# Patient Record
Sex: Female | Born: 1970 | Race: White | Hispanic: No | Smoking: Never smoker
Health system: Southern US, Community
[De-identification: ages and names within clinical notes are randomized; demographics above are authoritative.]

## PROBLEM LIST (undated history)

## (undated) HISTORY — PX: CHOLECYSTECTOMY: SHX55

---

## 1999-02-24 ENCOUNTER — Other Ambulatory Visit: Admission: RE | Admit: 1999-02-24 | Discharge: 1999-02-24 | Payer: Self-pay | Admitting: *Deleted

## 1999-06-27 ENCOUNTER — Encounter: Admission: RE | Admit: 1999-06-27 | Discharge: 1999-09-25 | Payer: Self-pay | Admitting: *Deleted

## 1999-09-02 ENCOUNTER — Inpatient Hospital Stay (HOSPITAL_COMMUNITY): Admission: AD | Admit: 1999-09-02 | Discharge: 1999-09-05 | Payer: Self-pay | Admitting: *Deleted

## 1999-09-06 ENCOUNTER — Encounter: Admission: RE | Admit: 1999-09-06 | Discharge: 1999-10-13 | Payer: Self-pay | Admitting: *Deleted

## 1999-10-05 ENCOUNTER — Other Ambulatory Visit: Admission: RE | Admit: 1999-10-05 | Discharge: 1999-10-05 | Payer: Self-pay | Admitting: *Deleted

## 2001-05-27 ENCOUNTER — Other Ambulatory Visit: Admission: RE | Admit: 2001-05-27 | Discharge: 2001-05-27 | Payer: Self-pay | Admitting: *Deleted

## 2001-12-03 ENCOUNTER — Inpatient Hospital Stay (HOSPITAL_COMMUNITY): Admission: AD | Admit: 2001-12-03 | Discharge: 2001-12-05 | Payer: Self-pay | Admitting: Obstetrics and Gynecology

## 2001-12-06 ENCOUNTER — Encounter: Admission: RE | Admit: 2001-12-06 | Discharge: 2002-01-05 | Payer: Self-pay | Admitting: *Deleted

## 2002-01-12 ENCOUNTER — Other Ambulatory Visit: Admission: RE | Admit: 2002-01-12 | Discharge: 2002-01-12 | Payer: Self-pay | Admitting: *Deleted

## 2003-01-20 ENCOUNTER — Other Ambulatory Visit: Admission: RE | Admit: 2003-01-20 | Discharge: 2003-01-20 | Payer: Self-pay | Admitting: *Deleted

## 2003-09-22 ENCOUNTER — Encounter: Admission: RE | Admit: 2003-09-22 | Discharge: 2003-09-22 | Payer: Self-pay | Admitting: Family Medicine

## 2004-02-22 ENCOUNTER — Other Ambulatory Visit: Admission: RE | Admit: 2004-02-22 | Discharge: 2004-02-22 | Payer: Self-pay | Admitting: Obstetrics and Gynecology

## 2005-06-08 ENCOUNTER — Other Ambulatory Visit: Admission: RE | Admit: 2005-06-08 | Discharge: 2005-06-08 | Payer: Self-pay | Admitting: Obstetrics and Gynecology

## 2005-07-25 ENCOUNTER — Encounter: Admission: RE | Admit: 2005-07-25 | Discharge: 2005-07-25 | Payer: Self-pay | Admitting: General Surgery

## 2005-09-20 ENCOUNTER — Encounter: Admission: RE | Admit: 2005-09-20 | Discharge: 2005-09-20 | Payer: Self-pay | Admitting: Obstetrics and Gynecology

## 2006-07-17 IMAGING — CT CT PELVIS W/ CM
1 of 3 series · 14 of 32 positions shown, 19 images · IV contrast (REDICAT/H2O & OMNIPAQUE [ID])
Comparison: none

CLINICAL DATA: Ventral hernia

[Series 2: — · axial · 0.98mm/px · z∈[-474,-69]mm · 14 of 91 slices shown, 19 images]
[im 5/91  soft-tissue]
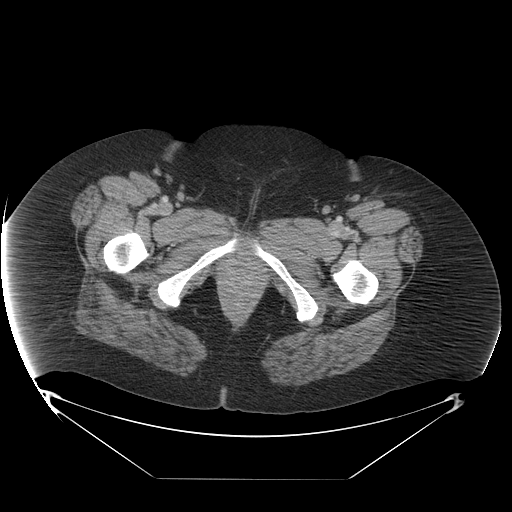
[im 5/91  bone]
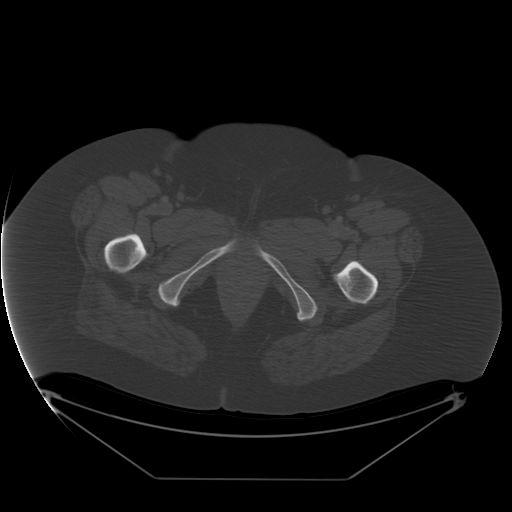
[im 14/91  soft-tissue]
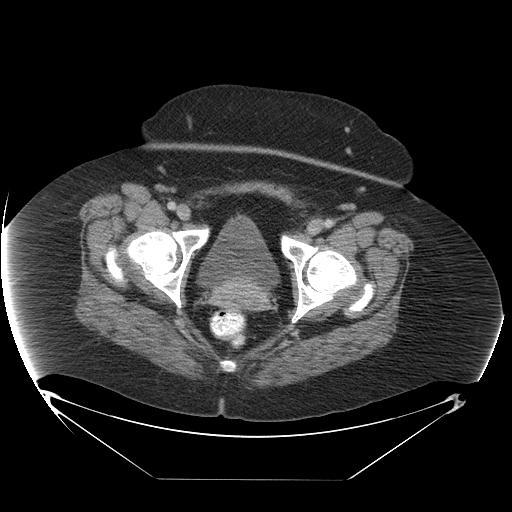
[im 19/91  soft-tissue]
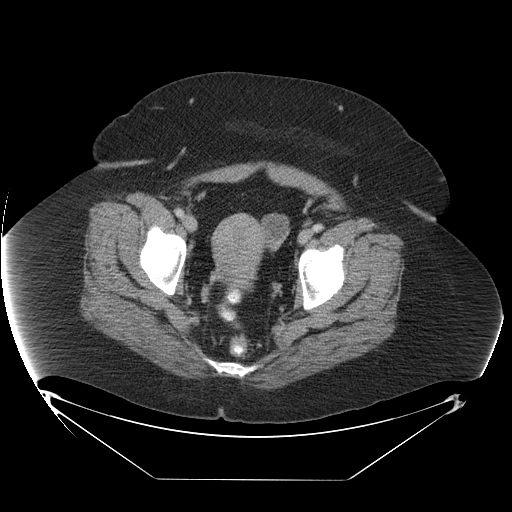
[im 28/91  soft-tissue]
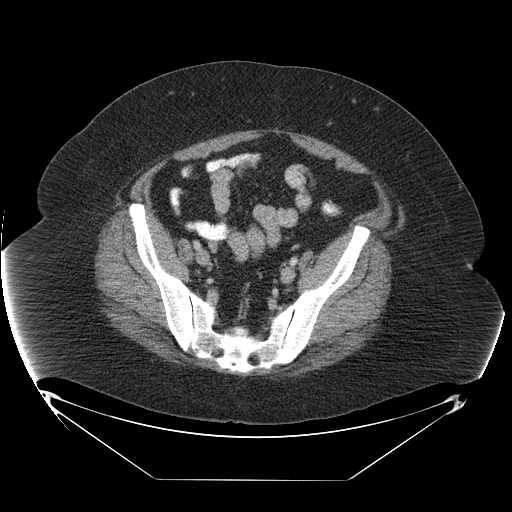
[im 32/91  soft-tissue]
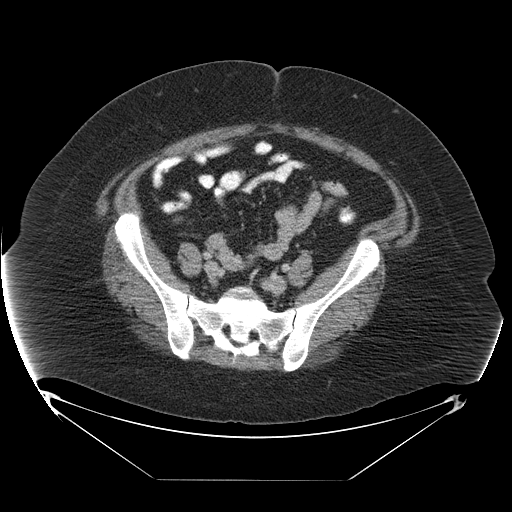
[im 41/91  soft-tissue]
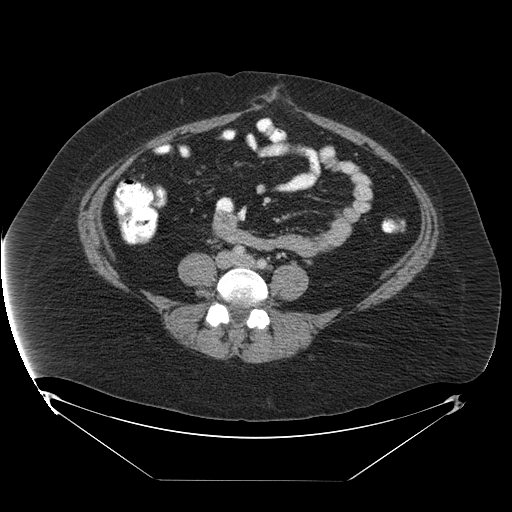
[im 46/91  soft-tissue]
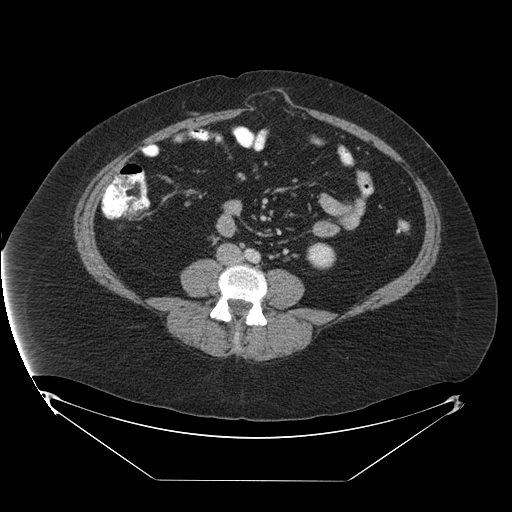
[im 50/91  soft-tissue]
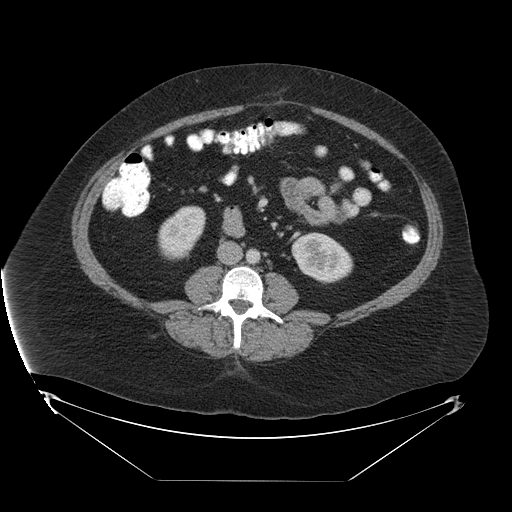
[im 59/91  soft-tissue]
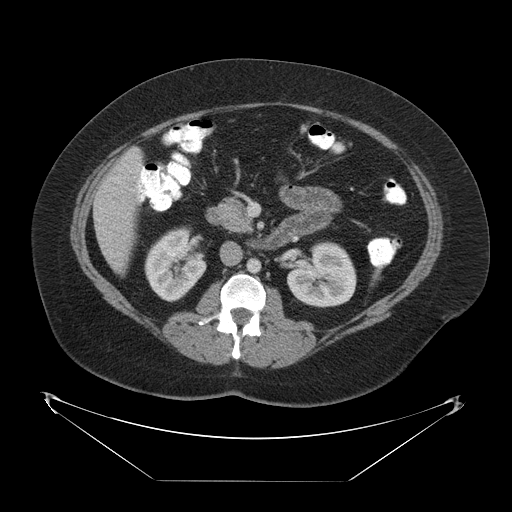
[im 59/91  bone]
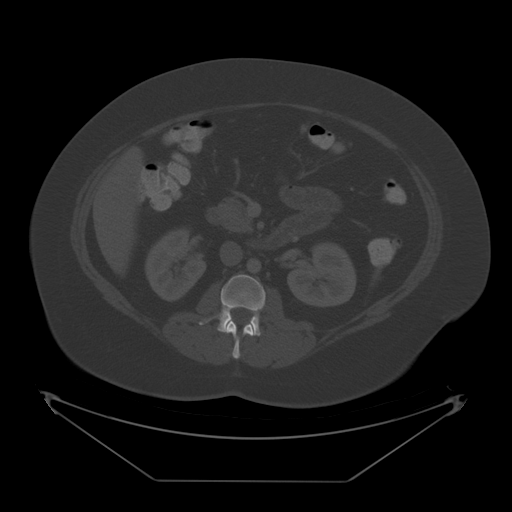
[im 64/91  soft-tissue]
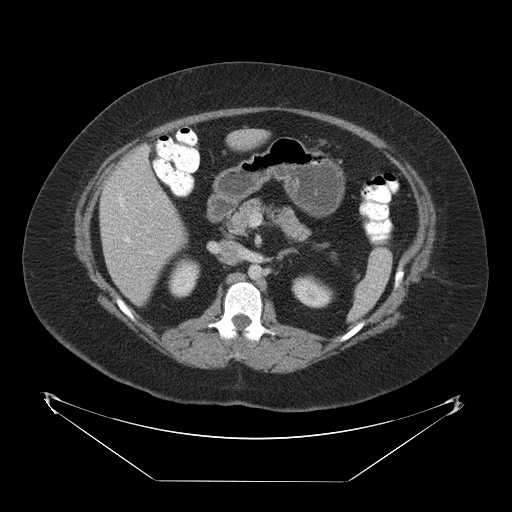
[im 73/91  soft-tissue]
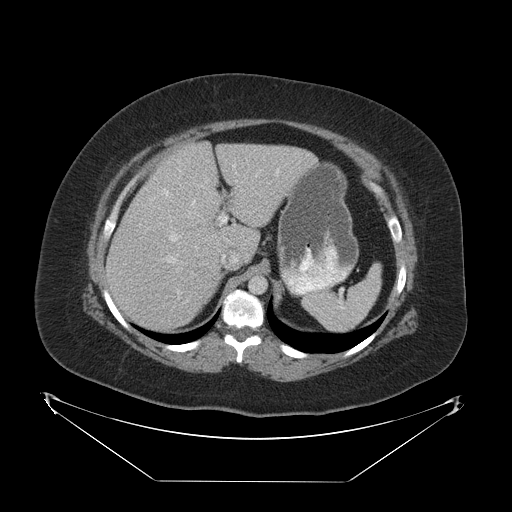
[im 73/91  lung]
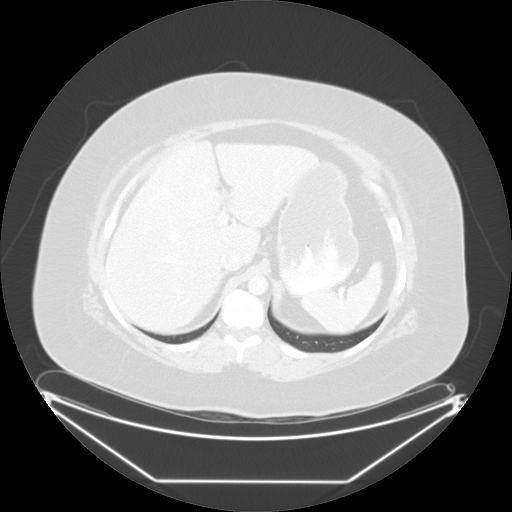
[im 77/91  soft-tissue]
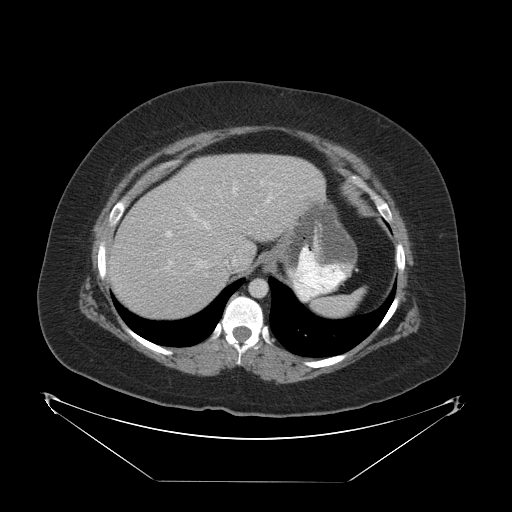
[im 77/91  lung]
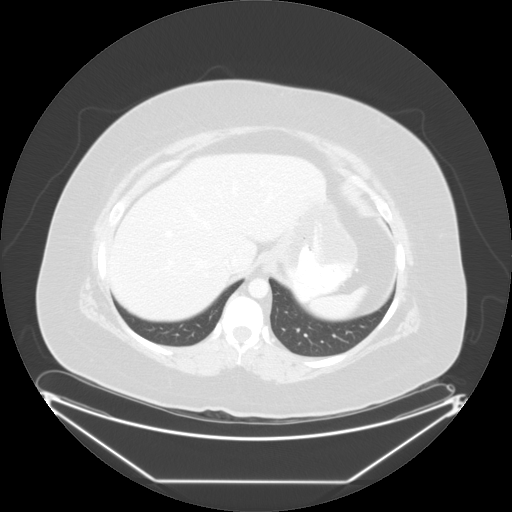
[im 82/91  lung]
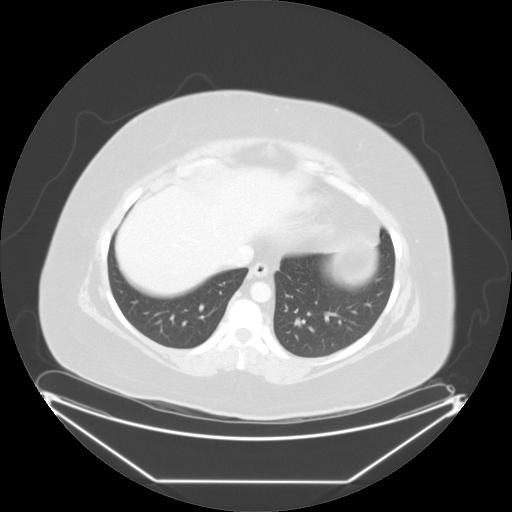
[im 86/91  soft-tissue]
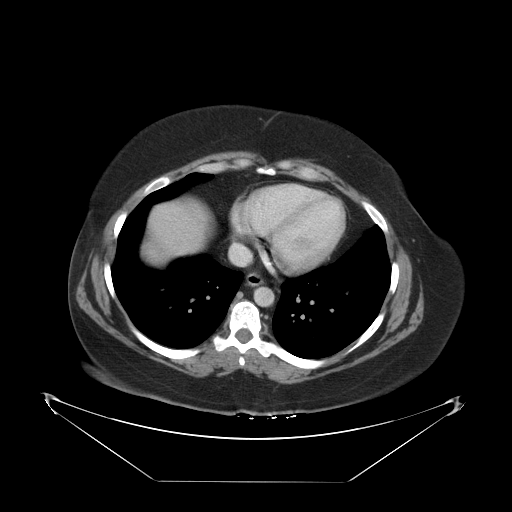
[im 86/91  lung]
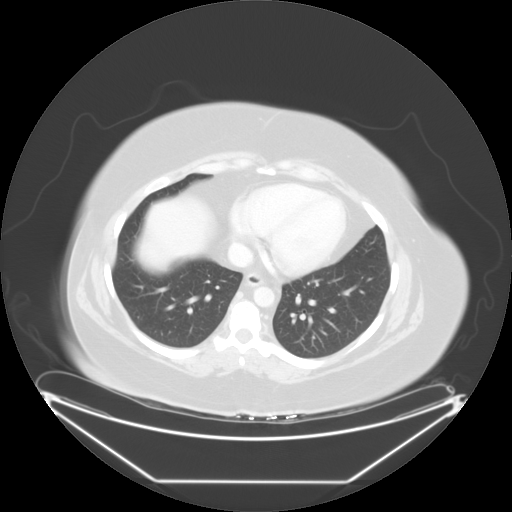

[14 of 32 positions shown; findings below may reference images not displayed]

CT abdomen with contrast:

Multidetector helical CT after 125  ml Nmnipaque-6PP IV.
No previous for comparison. Visualized lung bases clear. Vascular clips in the
gallbladder fossa. Unremarkable liver, spleen, adrenal glands, kidneys,
pancreas, abdominal aorta, and small bowel. There is a midline ventral hernia
and small umbilical hernia, each containing only small amount of mesenteric fat.
No inflammatory change or fluid to suggest   strangulation. There is no ascites.
No free air.
IMPRESSION: 1. Small ventral and umbilical hernias containing small amount of mesenteric
fat.
2. Otherwise unremarkable CT abdomen

CT pelvis with contrast:

Appendix not discretely identified. The colon is nondistended. A few scattered
diverticula from descending colon without adjacent inflammatory or edematous
change. Urinary bladder incompletely distended. Uterus and right ovary
unremarkable. There is a 2.5 cm low attenuation region within the left ovary. No
free fluid. No adenopathy localized.
IMPRESSION: 1. A few scattered descending colon diverticula without CT evidence of
diverticulitis.
2. Low attenuation left ovarian lesion, possibly a physiologic cyst. Consider
followup ultrasound after 2 menstrual cycles to confirm appropriate resolution.

## 2010-08-20 ENCOUNTER — Encounter: Payer: Self-pay | Admitting: Obstetrics and Gynecology

## 2018-03-02 ENCOUNTER — Ambulatory Visit (HOSPITAL_COMMUNITY)
Admission: EM | Admit: 2018-03-02 | Discharge: 2018-03-02 | Disposition: A | Discharge status: Home / Self Care | Payer: BC Managed Care – PPO | Attending: Family Medicine | Admitting: Family Medicine

## 2018-03-02 ENCOUNTER — Encounter (HOSPITAL_COMMUNITY): Payer: Self-pay | Admitting: Emergency Medicine

## 2018-03-02 ENCOUNTER — Other Ambulatory Visit: Payer: Self-pay

## 2018-03-02 DIAGNOSIS — W260XXA Contact with knife, initial encounter: Secondary | ICD-10-CM | POA: Diagnosis not present

## 2018-03-02 DIAGNOSIS — S61213A Laceration without foreign body of left middle finger without damage to nail, initial encounter: Secondary | ICD-10-CM | POA: Diagnosis not present

## 2018-03-02 DIAGNOSIS — Z23 Encounter for immunization: Secondary | ICD-10-CM

## 2018-03-02 MED ORDER — TETANUS-DIPHTH-ACELL PERTUSSIS 5-2.5-18.5 LF-MCG/0.5 IM SUSP
0.5000 mL | Freq: Once | INTRAMUSCULAR | Status: AC
  Administered 2018-03-02: 0.5 mL via INTRAMUSCULAR

## 2018-03-02 MED ORDER — TETANUS-DIPHTH-ACELL PERTUSSIS 5-2.5-18.5 LF-MCG/0.5 IM SUSP
INTRAMUSCULAR | Status: AC
  Filled 2018-03-02: qty 0.5

## 2018-03-02 NOTE — ED Notes (Signed)
Patient's finger was cleaned with sure clens wound cleanser and covered with gauze and kling wrap.

## 2018-03-02 NOTE — Discharge Instructions (Signed)
Tetanus updated today.  4 stitches placed.  You you can can remove current dressing in 24 hours.  Then clean gently with soap and water.  Do not soak area in water.  Keep wound clean and dry.  Monitor for spreading redness, increased warmth, fever, follow-up for reevaluation.  Otherwise, follow-up here with PCP in 7 days for suture removal.

## 2018-03-02 NOTE — ED Provider Notes (Signed)
[facility]    CSN: [id number] Arrival date & time: 03/02/18  1534     History   Chief Complaint Chief Complaint  Patient presents with  . Laceration    HPI Sandra Flores is a 47 y.o. female.   47 year old female comes in for laceration to the left middle finger.  States was grabbing the ceramic knife when the laceration occurred.  She has some numbness and tingling to the fingertips that is at baseline for her.  No trouble moving fingers.  Dressed the wound came in for evaluation.  Unknown last tetanus.     History reviewed. No pertinent past medical history.  There are no active problems to display for this patient.   Past Surgical History:  Procedure Laterality Date  . CHOLECYSTECTOMY      OB History   None      Home Medications    Prior to Admission medications   Not on File    Family History History reviewed. No pertinent family history.  Social History Social History   Tobacco Use  . Smoking status: Never Smoker  . Smokeless tobacco: Never Used  Substance Use Topics  . Alcohol use: Not Currently    Frequency: Never  . Drug use: Never     Allergies   Patient has no known allergies.   Review of Systems Review of Systems  Reason unable to perform ROS: See HPI as above.     Physical Exam Triage Vital Signs ED Triage Vitals  Enc Vitals Group     BP 03/02/18 1552 (!) 145/65     Pulse Rate 03/02/18 1552 91     Resp 03/02/18 1552 18     Temp 03/02/18 1552 98.2 F (36.8 C)     Temp Source 03/02/18 1552 Oral     SpO2 03/02/18 1552 98 %     Weight --      Height --      Head Circumference --      Peak Flow --      Pain Score 03/02/18 1551 1     Pain Loc --      Pain Edu? --      Excl. in GC? --    No data found.  Updated Vital Signs BP (!) 145/65 (BP Location: Right Arm)   Pulse 91   Temp 98.2 F (36.8 C) (Oral)   Resp 18   SpO2 98%   Physical Exam  Constitutional: She is oriented to person, place, and time.  She appears well-developed and well-nourished. No distress.  HENT:  Head: Normocephalic and atraumatic.  Eyes: Pupils are equal, round, and reactive to light. Conjunctivae are normal.  Musculoskeletal:  1.5cm laceration to the dorsal aspect of left middle finger superior to the PIP joint. Bleeding controlled with pressure. Full ROM of finger. Sensation intact and equal bilaterally. Radial pulse 2+, cap refill <2s  Neurological: She is alert and oriented to person, place, and time.  Skin: She is not diaphoretic.     UC Treatments / Results  Labs (all labs ordered are listed, but only abnormal results are displayed) Labs Reviewed - No data to display  EKG None  Radiology No results found.  Procedures Laceration Repair Date/Time: 03/02/2018 5:52 PM Performed by: Belinda Fisher, PA-C Authorized by: Eustace Moore, MD   Consent:    Consent obtained:  Verbal   Consent given by:  Patient   Risks discussed:  Infection, pain, poor cosmetic result and poor  wound healing   Alternatives discussed:  No treatment Anesthesia (see MAR for exact dosages):    Anesthesia method:  Local infiltration and nerve block   Local anesthetic:  Lidocaine 1% w/o epi   Block location:  Left middle finger   Block needle gauge:  27 G   Block anesthetic:  Lidocaine 1% w/o epi   Block injection procedure:  Anatomic landmarks identified, introduced needle, incremental injection, negative aspiration for blood and anatomic landmarks palpated   Block outcome:  Incomplete block Laceration details:    Location:  Finger   Finger location:  L long finger   Length (cm):  1.5   Depth (mm):  2 Repair type:    Repair type:  Simple Pre-procedure details:    Preparation:  Patient was prepped and draped in usual sterile fashion Exploration:    Hemostasis achieved with:  Direct pressure   Wound exploration: wound explored through full range of motion and entire depth of wound probed and visualized   Treatment:     Area cleansed with:  Betadine   Amount of cleaning:  Standard   Irrigation solution:  Sterile saline   Irrigation method:  Pressure wash   Visualized foreign bodies/material removed: no   Skin repair:    Repair method:  Sutures   Suture size:  5-0   Suture material:  Prolene   Suture technique:  Simple interrupted   Number of sutures:  4 Approximation:    Approximation:  Close Post-procedure details:    Dressing:  Antibiotic ointment and bulky dressing   Patient tolerance of procedure:  Tolerated well, no immediate complications   (including critical care time)  Medications Ordered in UC Medications  Tdap (BOOSTRIX) injection 0.5 mL (0.5 mLs Intramuscular Given 03/02/18 1746)    Initial Impression / Assessment and Plan / UC Course  I have reviewed the triage vital signs and the nursing notes.  Pertinent labs & imaging results that were available during my care of the patient were reviewed by me and considered in my medical decision making (see chart for details).    Tetanus updated.  Patient tolerated procedure well.  4 simple interrupted sutures applied.  Wound care instructions given.  Return precautions given.  Otherwise follow-up in 7 days for suture removal.  Final Clinical Impressions(s) / UC Diagnoses   Final diagnoses:  Laceration of left middle finger without foreign body without damage to nail, initial encounter    ED Prescriptions    None        Belinda FisherYu, Destenee Guerry V, PA-C 03/02/18 1753

## 2018-03-02 NOTE — ED Triage Notes (Signed)
[facility] to [facility] middle finger on her left hand that occurred today with a ceramic knife.

## 2019-10-04 ENCOUNTER — Ambulatory Visit: Payer: BC Managed Care – PPO | Attending: Internal Medicine

## 2019-10-04 DIAGNOSIS — Z23 Encounter for immunization: Secondary | ICD-10-CM | POA: Insufficient documentation

## 2019-10-04 NOTE — Progress Notes (Signed)
   Covid-19 Vaccination Clinic  Name:  Sandra Flores    MRN: [id number] DOB: 1971/07/17  10/04/2019  [name] was observed post Covid-19 immunization for 15 minutes without incident. She was provided with Vaccine Information Sheet and instruction to access the V-Safe system.   [name] was instructed to call 911 with any severe reactions post vaccine: Marland Kitchen Difficulty breathing  . Swelling of face and throat  . A fast heartbeat  . A bad rash all over body  . Dizziness and weakness   Immunizations Administered    Name Date Dose VIS Date Route   Pfizer COVID-19 Vaccine 10/04/2019  2:39 PM 0.3 mL 07/10/2019 Intramuscular   Manufacturer: [employer], [employer]   Lot: [id number]   NDC: [address]-7

## 2019-10-25 ENCOUNTER — Ambulatory Visit: Payer: BC Managed Care – PPO | Attending: Internal Medicine

## 2019-10-25 DIAGNOSIS — Z23 Encounter for immunization: Secondary | ICD-10-CM

## 2019-10-25 NOTE — Progress Notes (Signed)
   Covid-19 Vaccination Clinic  Name:  Sandra Flores    MRN: [id number] DOB: April 24, 1971  10/25/2019  [name] was observed post Covid-19 immunization for 15 minutes without incident. She was provided with Vaccine Information Sheet and instruction to access the V-Safe system.   [name] was instructed to call 911 with any severe reactions post vaccine: Marland Kitchen Difficulty breathing  . Swelling of face and throat  . A fast heartbeat  . A bad rash all over body  . Dizziness and weakness   Immunizations Administered    Name Date Dose VIS Date Route   Pfizer COVID-19 Vaccine 10/25/2019  1:15 PM 0.3 mL 07/10/2019 Intramuscular   Manufacturer: [employer], [employer]   Lot: [id number]   NDC: [address]-1
